# Patient Record
Sex: Male | Born: 1980 | Race: Black or African American | Hispanic: No | Marital: Single | Smoking: Current every day smoker
Health system: Southern US, Community
[De-identification: ages and names within clinical notes are randomized; demographics above are authoritative.]

---

## 2015-05-10 ENCOUNTER — Emergency Department
Admission: EM | Admit: 2015-05-10 | Discharge: 2015-05-10 | Disposition: A | Discharge status: Home / Self Care | Payer: Self-pay | Attending: Emergency Medicine | Admitting: Emergency Medicine

## 2015-05-10 ENCOUNTER — Encounter: Payer: Self-pay | Admitting: Emergency Medicine

## 2015-05-10 DIAGNOSIS — Z720 Tobacco use: Secondary | ICD-10-CM | POA: Insufficient documentation

## 2015-05-10 DIAGNOSIS — Z79899 Other long term (current) drug therapy: Secondary | ICD-10-CM | POA: Insufficient documentation

## 2015-05-10 DIAGNOSIS — H109 Unspecified conjunctivitis: Secondary | ICD-10-CM | POA: Insufficient documentation

## 2015-05-10 MED ORDER — SULFACETAMIDE SODIUM 10 % OP SOLN: 2.0000 [drp] | Freq: Four times a day (QID) | OPHTHALMIC | Status: DC

## 2015-05-10 NOTE — ED Notes (Signed)
Here for redness in right eye with clear drainage.

## 2015-05-10 NOTE — Discharge Instructions (Signed)

## 2015-05-10 NOTE — ED Provider Notes (Signed)
[facility] Emergency Department Provider Note  ____________________________________________  Time seen: Approximately 2:50 PM  I have reviewed the triage vital signs and the nursing notes.   HISTORY  Chief Complaint Eye Problem    HPI Jack Young is a 34 y.o. male 3 day history of redness and drainage to right eye. Eyes stuck shut in the mornings. Denies any trauma denies any visual changes.    No past medical history on file.  There are no active problems to display for this patient.   No past surgical history on file.  Current Outpatient Rx  Name  Route  Sig  Dispense  Refill  . sulfacetamide (BLEPH-10) 10 % ophthalmic solution   Both Eyes   Place 2 drops into both eyes 4 (four) times daily.   5 mL   0     Allergies Review of patient's allergies indicates no known allergies.  History reviewed. No pertinent family history.  Social History Social History  Substance Use Topics  . Smoking status: Current Every Day Smoker  . Smokeless tobacco: None  . Alcohol Use: None    Review of Systems  Constitutional: No fever/chills Eyes: No visual changes. Positive for red eyes and drainage ENT: No sore throat. Cardiovascular: Denies chest pain. Respiratory: Denies shortness of breath. Gastrointestinal: No abdominal pain.  No nausea, no vomiting.  No diarrhea.  No constipation. Genitourinary: Negative for dysuria. Musculoskeletal: Negative for back pain. Skin: Negative for rash. Neurological: Negative for headaches, focal weakness or numbness.   10-point ROS otherwise negative.  ____________________________________________   PHYSICAL EXAM:  VITAL SIGNS: ED Triage Vitals  Enc Vitals Group     BP 05/10/15 1349 108/59 mmHg     Pulse Rate 05/10/15 1349 67     Resp 05/10/15 1349 16     Temp 05/10/15 1349 97.7 F (36.5 C)     Temp Source 05/10/15 1349 Oral     SpO2 05/10/15 1349 100 %     Weight --      Height --      Head Cir  --      Peak Flow --      Pain Score 05/10/15 1348 8     Pain Loc --      Pain Edu? --      Excl. in GC? --     Constitutional: Alert and oriented. Well appearing and in no acute distress. Eyes: Right conjunctiva very erythematous with drainage noted.Marland Kitchen PERRL. EOMI. Head: Atraumatic. Nose: No congestion/rhinnorhea. Mouth/Throat: Mucous membranes are moist.  Oropharynx non-erythematous. Neck: No stridor.   Cardiovascular: Normal rate, regular rhythm. Grossly normal heart sounds.  Good peripheral circulation. Respiratory: Normal respiratory effort.  No retractions. Lungs CTAB. Gastrointestinal: Soft and nontender. No distention. No abdominal bruits. No CVA tenderness. Musculoskeletal: No lower extremity tenderness nor edema.  No joint effusions. Neurologic:  Normal speech and language. No gross focal neurologic deficits are appreciated. No gait instability. Skin:  Skin is warm, dry and intact. No rash noted. Psychiatric: Mood and affect are normal. Speech and behavior are normal.  ____________________________________________   LABS (all labs ordered are listed, but only abnormal results are displayed)  Labs Reviewed - No data to display ____________________________________________    PROCEDURES  Procedure(s) performed: None  Critical Care performed: No  ____________________________________________   INITIAL IMPRESSION / ASSESSMENT AND PLAN / ED COURSE  Pertinent labs & imaging results that were available during my care of the patient were reviewed by me and considered in my  medical decision making (see chart for details).  Right conjunctivitis. Rx given for sodium Sulamyd eyedrops. Work excuse 24 hours. Patient to follow up with PCP or return to the ER with any worsening symptomology.  Patient voices no other emergency medical complaints at this visit. ____________________________________________   FINAL CLINICAL IMPRESSION(S) / ED DIAGNOSES  Final diagnoses:   Conjunctivitis of right eye      Evangeline Dakin, PA-C 05/10/15 1459  Jennye Moccasin, MD 05/10/15 [phone]

## 2015-05-14 ENCOUNTER — Encounter: Payer: Self-pay | Admitting: Emergency Medicine

## 2015-05-14 ENCOUNTER — Emergency Department
Admission: EM | Admit: 2015-05-14 | Discharge: 2015-05-14 | Disposition: A | Discharge status: Home / Self Care | Payer: Self-pay | Attending: Emergency Medicine | Admitting: Emergency Medicine

## 2015-05-14 DIAGNOSIS — Z720 Tobacco use: Secondary | ICD-10-CM | POA: Insufficient documentation

## 2015-05-14 DIAGNOSIS — H109 Unspecified conjunctivitis: Secondary | ICD-10-CM | POA: Insufficient documentation

## 2015-05-14 DIAGNOSIS — Z79899 Other long term (current) drug therapy: Secondary | ICD-10-CM | POA: Insufficient documentation

## 2015-05-14 MED ORDER — POLYMYXIN B-TRIMETHOPRIM 10000-0.1 UNIT/ML-% OP SOLN: 2.0000 [drp] | OPHTHALMIC | Status: DC

## 2015-05-14 NOTE — ED Notes (Signed)
Patient here for recheck of pink eye symptoms, reports better but still bothering him.

## 2015-05-14 NOTE — Discharge Instructions (Signed)

## 2015-05-14 NOTE — ED Provider Notes (Signed)
[facility] Emergency Department Provider Note  ____________________________________________  Time seen: Approximately 2:18 PM  I have reviewed the triage vital signs and the nursing notes.   HISTORY  Chief Complaint Conjunctivitis    HPI Jacarri Gesner is a 34 y.o. male presents for recheck of conjunctivitis of his eyes. Patient states that when he was seen here 3 days ago was in his right eye but has now spread to his left eye. Has been taking his antibiotic eyedrops for 2 days. He feels like his eyes are getting better just not as quick as he would like them to. Reports that his eyes are stuck together in the mornings only.   History reviewed. No pertinent past medical history.  There are no active problems to display for this patient.   History reviewed. No pertinent past surgical history.  Current Outpatient Rx  Name  Route  Sig  Dispense  Refill  . sulfacetamide (BLEPH-10) 10 % ophthalmic solution   Both Eyes   Place 2 drops into both eyes 4 (four) times daily.   5 mL   0   . trimethoprim-polymyxin b (POLYTRIM) ophthalmic solution   Both Eyes   Place 2 drops into both eyes every 4 (four) hours.   10 mL   0     Allergies Review of patient's allergies indicates no known allergies.  History reviewed. No pertinent family history.  Social History Social History  Substance Use Topics  . Smoking status: Current Every Day Smoker  . Smokeless tobacco: None  . Alcohol Use: None    Review of Systems Constitutional: No fever/chills Eyes: No visual changes. Positive red eyes bilaterally ENT: No sore throat. Cardiovascular: Denies chest pain. Respiratory: Denies shortness of breath. Gastrointestinal: No abdominal pain.  No nausea, no vomiting.  No diarrhea.  No constipation. Genitourinary: Negative for dysuria. Musculoskeletal: Negative for back pain. Skin: Negative for rash. Neurological: Negative for headaches, focal weakness or  numbness.  10-point ROS otherwise negative.  ____________________________________________   PHYSICAL EXAM:  VITAL SIGNS: ED Triage Vitals  Enc Vitals Group     BP 05/14/15 1410 118/66 mmHg     Pulse Rate 05/14/15 1410 57     Resp 05/14/15 1410 18     Temp 05/14/15 1410 98.2 F (36.8 C)     Temp Source 05/14/15 1410 Oral     SpO2 05/14/15 1410 100 %     Weight 05/14/15 1410 140 lb (63.504 kg)     Height 05/14/15 1410  (1.778 m)     Head Cir --      Peak Flow --      Pain Score --      Pain Loc --      Pain Edu? --      Excl. in GC? --     Constitutional: Alert and oriented. Well appearing and in no acute distress. Eyes: Conjunctivae are erythematous bilaterally. Positive exudate. PERRL. EOMI. Head: Atraumatic. Nose: No congestion/rhinnorhea. Neck: No stridor.  No adenopathy Musculoskeletal: No lower extremity tenderness nor edema.  No joint effusions. Neurologic:  Normal speech and language. No gross focal neurologic deficits are appreciated. No gait instability. Skin:  Skin is warm, dry and intact. No rash noted. Psychiatric: Mood and affect are normal. Speech and behavior are normal.  ____________________________________________   LABS (all labs ordered are listed, but only abnormal results are displayed)  Labs Reviewed - No data to display ____________________________________________  PROCEDURES  Procedure(s) performed: None  Critical Care performed:  No  ____________________________________________   INITIAL IMPRESSION / ASSESSMENT AND PLAN / ED COURSE  Pertinent labs & imaging results that were available during my care of the patient were reviewed by me and considered in my medical decision making (see chart for details).  Bilateral conjunctivitis. Rx encourage to be continued with present drops. Follow up with PCP or return to the ER if no results in nor worsening symptomology next 48  hours. ____________________________________________   FINAL CLINICAL IMPRESSION(S) / ED DIAGNOSES  Final diagnoses:  Bilateral conjunctivitis      Evangeline Dakin, PA-C 05/14/15 1442  Phineas Semen, MD 05/14/15 1453

## 2015-12-13 ENCOUNTER — Encounter: Payer: Self-pay | Admitting: Emergency Medicine

## 2015-12-13 ENCOUNTER — Emergency Department
Admission: EM | Admit: 2015-12-13 | Discharge: 2015-12-13 | Disposition: A | Discharge status: Home / Self Care | Payer: Self-pay | Attending: Emergency Medicine | Admitting: Emergency Medicine

## 2015-12-13 ENCOUNTER — Emergency Department: Payer: Self-pay

## 2015-12-13 DIAGNOSIS — F17200 Nicotine dependence, unspecified, uncomplicated: Secondary | ICD-10-CM | POA: Insufficient documentation

## 2015-12-13 DIAGNOSIS — S20212A Contusion of left front wall of thorax, initial encounter: Secondary | ICD-10-CM | POA: Insufficient documentation

## 2015-12-13 DIAGNOSIS — Y999 Unspecified external cause status: Secondary | ICD-10-CM | POA: Insufficient documentation

## 2015-12-13 DIAGNOSIS — Y939 Activity, unspecified: Secondary | ICD-10-CM | POA: Insufficient documentation

## 2015-12-13 DIAGNOSIS — Y929 Unspecified place or not applicable: Secondary | ICD-10-CM | POA: Insufficient documentation

## 2015-12-13 MED ORDER — KETOROLAC TROMETHAMINE 10 MG PO TABS
10.0000 mg | ORAL_TABLET | Freq: Once | ORAL | Status: AC
  Administered 2015-12-13: 10 mg via ORAL
  Filled 2015-12-13: qty 1

## 2015-12-13 MED ORDER — KETOROLAC TROMETHAMINE 10 MG PO TABS: 10.0000 mg | ORAL_TABLET | Freq: Three times a day (TID) | ORAL | Status: DC | PRN
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
## 2015-12-13 NOTE — Discharge Instructions (Signed)
Rib Contusion A rib contusion is a deep bruise on your rib area. Contusions are the result of a blunt trauma that causes bleeding and injury to the tissues under the skin. A rib contusion may involve bruising of the ribs and of the skin and muscles in the area. The skin overlying the contusion may turn blue, purple, or yellow. Minor injuries will give you a painless contusion, but more severe contusions may stay painful and swollen for a few weeks. CAUSES  A contusion is usually caused by a blow, trauma, or direct force to an area of the body. This often occurs while playing contact sports. SYMPTOMS  Swelling and redness of the injured area.  Discoloration of the injured area.  Tenderness and soreness of the injured area.  Pain with or without movement. DIAGNOSIS  The diagnosis can be made by taking a medical history and performing a physical exam. An X-ray, CT scan, or MRI may be needed to determine if there were any associated injuries, such as broken bones (fractures) or internal injuries. TREATMENT  Often, the best treatment for a rib contusion is rest. Icing or applying cold compresses to the injured area may help reduce swelling and inflammation. Deep breathing exercises may be recommended to reduce the risk of partial lung collapse and pneumonia. Over-the-counter or prescription medicines may also be recommended for pain control. HOME CARE INSTRUCTIONS   Apply ice to the injured area:  Put ice in a plastic bag.  Place a towel between your skin and the bag.  Leave the ice on for 20 minutes, 2-3 times per day.  Take medicines only as directed by your health care provider.  Rest the injured area. Avoid strenuous activity and any activities or movements that cause pain. Be careful during activities and avoid bumping the injured area.  Perform deep-breathing exercises as directed by your health care provider.  Do not lift anything that is heavier than 5 lb (2.3 kg) until your  health care provider approves.  Do not use any tobacco products, including cigarettes, chewing tobacco, or electronic cigarettes. If you need help quitting, ask your health care provider. SEEK MEDICAL CARE IF:   You have increased bruising or swelling.  You have pain that is not controlled with treatment.  You have a fever. SEEK IMMEDIATE MEDICAL CARE IF:   You have difficulty breathing or shortness of breath.  You develop a continual cough, or you cough up thick or bloody sputum.  You feel sick to your stomach (nauseous), you throw up (vomit), or you have abdominal pain.   This information is not intended to replace advice given to you by your health care provider. Make sure you discuss any questions you have with your health care provider.   Document Released: 05/14/2001 Document Revised: 09/09/2014 Document Reviewed: 05/31/2014 Elsevier Interactive Patient Education 2016 [employer]  
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
## 2016-09-11 ENCOUNTER — Encounter: Payer: Self-pay | Admitting: *Deleted

## 2016-09-11 ENCOUNTER — Emergency Department
Admission: EM | Admit: 2016-09-11 | Discharge: 2016-09-11 | Disposition: A | Discharge status: Home / Self Care | Payer: Self-pay | Attending: Emergency Medicine | Admitting: Emergency Medicine

## 2016-09-11 DIAGNOSIS — K047 Periapical abscess without sinus: Secondary | ICD-10-CM | POA: Insufficient documentation

## 2016-09-11 DIAGNOSIS — F17200 Nicotine dependence, unspecified, uncomplicated: Secondary | ICD-10-CM | POA: Insufficient documentation

## 2016-09-11 MED ORDER — IBUPROFEN 800 MG PO TABS
800.0000 mg | ORAL_TABLET | Freq: Three times a day (TID) | ORAL | 0 refills | Status: DC | PRN
Start: 2016-09-11 — End: 2018-07-24

## 2016-09-11 MED ORDER — PENICILLIN V POTASSIUM 500 MG PO TABS: 500.0000 mg | ORAL_TABLET | Freq: Four times a day (QID) | ORAL | 0 refills | Status: DC

## 2016-09-11 NOTE — ED Provider Notes (Signed)
[facility] Emergency Department Provider Note        Time seen: ----------------------------------------- 1:27 PM on 09/11/2016 -----------------------------------------    I have reviewed the triage vital signs and the nursing notes.   HISTORY  Chief Complaint Dental Pain    HPI Jack Young is a 36 y.o. male who presents to the ER for dental pain and swelling on the right side of his face. Patient reports a history of having a tooth broken on that side. Pain is worse with eating, nothing makes it better. Currently 10. Patient states he does not have dental insurance.   History reviewed. No pertinent past medical history.  There are no active problems to display for this patient.   History reviewed. No pertinent surgical history.  Allergies Patient has no known allergies.  Social History Social History  Substance Use Topics  . Smoking status: Current Every Day Smoker  . Smokeless tobacco: Not on file  . Alcohol use Not on file    Review of Systems Constitutional: Negative for fever. ENT: Positive for toothache, facial swelling Cardiovascular: Negative for chest pain. Respiratory: Negative for shortness of breath. Skin: Negative for rash. Neurological: Negative for headaches, focal weakness or numbness.  10-point ROS otherwise negative.  ____________________________________________   PHYSICAL EXAM:  VITAL SIGNS: ED Triage Vitals  Enc Vitals Group     BP 09/11/16 1214 119/65     Pulse Rate 09/11/16 1214 (!) 55     Resp 09/11/16 1214 18     Temp 09/11/16 1214 98.3 F (36.8 C)     Temp Source 09/11/16 1214 Oral     SpO2 09/11/16 1214 100 %     Weight 09/11/16 1214 155 lb (70.3 kg)     Height 09/11/16 1214 5\' 10"  (1.778 m)     Head Circumference --      Peak Flow --      Pain Score 09/11/16 1213 9     Pain Loc --      Pain Edu? --      Excl. in GC? --     Constitutional: Alert and oriented. Well appearing and in no  distress. Eyes: Conjunctivae are normal. PERRL. Normal extraocular movements. ENT   Head: Normocephalic and atraumatic.   Nose: No congestion/rhinnorhea.   Mouth/Throat: Mucous membranes are moist.Dental caries are noted, there is tooth fracture and perimandibular abscess noted anteriorly on the lower right   Neck: No stridor. Respiratory: Normal respiratory effort without tachypnea nor retractions.  Neurologic:  Normal speech and language. No gross focal neurologic deficits are appreciated.  Skin:  Skin is warm, dry and intact. No rash noted. Psychiatric: Mood and affect are normal. Speech and behavior are normal.  ____________________________________________  ED COURSE:  Pertinent labs & imaging results that were available during my care of the patient were reviewed by me and considered in my medical decision making (see chart for details). Clinical Course   Patient is in no acute distress, presents for toothache. He'll be discharged with antibiotics and pain medication and referral to a dental clinic.  Procedures ____________________________________________  FINAL ASSESSMENT AND PLAN  Toothache, dental abscess  Plan: Patient with toothache and dental abscess. He'll be started on penicillin with Motrin and referred to dentistry for outpatient follow-up.   Emily FilbertWilliams, Terrisha Lopata E, MD   Note: This dictation was prepared with Dragon dictation. Any transcriptional errors that result from this process are unintentional    Emily FilbertJonathan E Mollye Guinta, MD 09/11/16 1329

## 2016-09-11 NOTE — ED Triage Notes (Signed)
States dental pain and swelling on the right side of his face, states broken tooth

## 2016-12-01 ENCOUNTER — Emergency Department
Admission: EM | Admit: 2016-12-01 | Discharge: 2016-12-01 | Disposition: A | Discharge status: Home / Self Care | Payer: Self-pay | Attending: Emergency Medicine | Admitting: Emergency Medicine

## 2016-12-01 ENCOUNTER — Encounter: Payer: Self-pay | Admitting: Emergency Medicine

## 2016-12-01 DIAGNOSIS — Z791 Long term (current) use of non-steroidal anti-inflammatories (NSAID): Secondary | ICD-10-CM | POA: Insufficient documentation

## 2016-12-01 DIAGNOSIS — Y929 Unspecified place or not applicable: Secondary | ICD-10-CM | POA: Insufficient documentation

## 2016-12-01 DIAGNOSIS — Y9389 Activity, other specified: Secondary | ICD-10-CM | POA: Insufficient documentation

## 2016-12-01 DIAGNOSIS — Y999 Unspecified external cause status: Secondary | ICD-10-CM | POA: Insufficient documentation

## 2016-12-01 DIAGNOSIS — S81801A Unspecified open wound, right lower leg, initial encounter: Secondary | ICD-10-CM | POA: Insufficient documentation

## 2016-12-01 DIAGNOSIS — Z79899 Other long term (current) drug therapy: Secondary | ICD-10-CM | POA: Insufficient documentation

## 2016-12-01 DIAGNOSIS — Z23 Encounter for immunization: Secondary | ICD-10-CM | POA: Insufficient documentation

## 2016-12-01 DIAGNOSIS — F17210 Nicotine dependence, cigarettes, uncomplicated: Secondary | ICD-10-CM | POA: Insufficient documentation

## 2016-12-01 MED ORDER — TETANUS-DIPHTH-ACELL PERTUSSIS 5-2.5-18.5 LF-MCG/0.5 IM SUSP
0.5000 mL | Freq: Once | INTRAMUSCULAR | Status: AC
  Administered 2016-12-01: 0.5 mL via INTRAMUSCULAR
  Filled 2016-12-01: qty 0.5

## 2016-12-01 MED ORDER — IBUPROFEN 600 MG PO TABS: 600.0000 mg | ORAL_TABLET | Freq: Three times a day (TID) | ORAL | 0 refills | Status: DC | PRN

## 2016-12-01 MED ORDER — SULFAMETHOXAZOLE-TRIMETHOPRIM 800-160 MG PO TABS: 1.0000 | ORAL_TABLET | Freq: Two times a day (BID) | ORAL | 0 refills | Status: DC

## 2016-12-01 MED ORDER — TRAMADOL HCL 50 MG PO TABS
50.0000 mg | ORAL_TABLET | Freq: Once | ORAL | Status: DC
Start: 2016-12-01 — End: 2016-12-02

## 2016-12-01 MED ORDER — SULFAMETHOXAZOLE-TRIMETHOPRIM 800-160 MG PO TABS
1.0000 | ORAL_TABLET | Freq: Once | ORAL | Status: AC
  Administered 2016-12-01: 1 via ORAL
  Filled 2016-12-01: qty 1
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
## 2016-12-01 NOTE — ED Notes (Signed)
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
## 2016-12-04 ENCOUNTER — Emergency Department
Admission: EM | Admit: 2016-12-04 | Discharge: 2016-12-04 | Disposition: A | Discharge status: Home / Self Care | Payer: Self-pay | Attending: Emergency Medicine | Admitting: Emergency Medicine

## 2016-12-04 DIAGNOSIS — Z4800 Encounter for change or removal of nonsurgical wound dressing: Secondary | ICD-10-CM | POA: Insufficient documentation

## 2016-12-04 DIAGNOSIS — Z791 Long term (current) use of non-steroidal anti-inflammatories (NSAID): Secondary | ICD-10-CM | POA: Insufficient documentation

## 2016-12-04 DIAGNOSIS — F17210 Nicotine dependence, cigarettes, uncomplicated: Secondary | ICD-10-CM | POA: Insufficient documentation

## 2016-12-04 DIAGNOSIS — Z79899 Other long term (current) drug therapy: Secondary | ICD-10-CM | POA: Insufficient documentation

## 2016-12-04 DIAGNOSIS — M25561 Pain in right knee: Secondary | ICD-10-CM | POA: Insufficient documentation

## 2016-12-04 DIAGNOSIS — Z5189 Encounter for other specified aftercare: Secondary | ICD-10-CM
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
## 2016-12-04 NOTE — Discharge Instructions (Signed)
Keep area clean and dry. Watch for signs of infection. Change dressing daily. Do not take anti-inflammatories at this time because of increased risk of bleeding. May take Tylenol if needed. Follow-up with [facility]  clinic if any continued problems.

## 2016-12-04 NOTE — ED Notes (Signed)
AAOx3.  Skin warm and dry.  Ambulates with easy and steady gait. NAD 

## 2016-12-04 NOTE — ED Notes (Signed)
Approximate 1 inch wound area to medial lower leg / knee area.  Bleeding controlled.  Skin warm and dry.
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
## 2018-07-24 ENCOUNTER — Encounter: Payer: Self-pay | Admitting: Emergency Medicine

## 2018-07-24 ENCOUNTER — Other Ambulatory Visit: Payer: Self-pay

## 2018-07-24 ENCOUNTER — Emergency Department
Admission: EM | Admit: 2018-07-24 | Discharge: 2018-07-24 | Disposition: A | Discharge status: Home / Self Care | Payer: Self-pay | Attending: Emergency Medicine | Admitting: Emergency Medicine

## 2018-07-24 DIAGNOSIS — K029 Dental caries, unspecified: Secondary | ICD-10-CM

## 2018-07-24 DIAGNOSIS — F17200 Nicotine dependence, unspecified, uncomplicated: Secondary | ICD-10-CM | POA: Insufficient documentation

## 2018-07-24 DIAGNOSIS — K047 Periapical abscess without sinus: Secondary | ICD-10-CM | POA: Insufficient documentation

## 2018-07-24 MED ORDER — AMOXICILLIN 500 MG PO CAPS
500.0000 mg | ORAL_CAPSULE | Freq: Once | ORAL | Status: AC
  Administered 2018-07-24: 500 mg via ORAL
  Filled 2018-07-24: qty 1

## 2018-07-24 MED ORDER — AMOXICILLIN 500 MG PO CAPS: 500.0000 mg | ORAL_CAPSULE | Freq: Three times a day (TID) | ORAL | 0 refills | Status: AC

## 2018-07-24 NOTE — Discharge Instructions (Addendum)
You are being treated for a dental abscess. Take the antibiotic as directed. Rinse with warm salty water after every meal. Brush with a soft toothbrush twice daily. Follow-up with one of the dental clinics listed below.   OPTIONS FOR DENTAL FOLLOW UP CARE  [place] Department of Health and Human Services - Local Safety Net Dental Clinics TripDoors.comhttp://www.ncdhhs.gov/dph/oralhealth/services/safetynetclinics.htm   [facility] [phone])  [facility] [phone])  [facility] [phone]  [facility] [phone])  [facility] [phone]) This clinic caters to the indigent population and is on a lottery system. Location: [employer] [address] [address], [address], [place] [facility] Hours: Wednesdays from 6pm - 9pm, patients seen by a lottery system. For dates, call or go to [email or web address] Services: Cleanings, fillings and simple extractions. Payment Options: DENTAL WORK IS FREE OF CHARGE. Bring proof of income or support. Best way to get seen: Arrive at 5:15 pm - this is a lottery, NOT first come/first serve, so arriving earlier will not increase your chances of being seen.     [facility] [phone] Select option 1 for emergencies   Location: [facility], [address], [place] [facility] Hours: No walk-ins accepted - call the day before to schedule an appointment. Check in times are 9:30 am and 1:30 pm. Services: Simple extractions, temporary fillings, pulpectomy/pulp debridement, uncomplicated abscess drainage. Payment Options: PAYMENT IS DUE AT THE TIME OF SERVICE.  Fee is usually $100-200, additional surgical procedures (e.g. abscess drainage) may be extra. Cash, checks, Visa/MasterCard accepted.  Can file Medicaid if patient is covered for dental - patient should call case worker to check. No discount for  [facility] patients. Best way to get seen: MUST call the day before and get onto the schedule. Can usually be seen the next 1-2 days. No walk-ins accepted.     [facility] [phone]   Location: [facility], [address], [place] [facility] Hours: M, W, Th, F 8am or 1:30pm, Tues 9a or 1:30 - first come/first served. Services: Simple extractions, temporary fillings, uncomplicated abscess drainage.  You do not need to be an [facility] [place] [place] resident. Payment Options: PAYMENT IS DUE AT THE TIME OF SERVICE. Dental insurance, otherwise sliding scale - bring proof of income or support. Depending on income and treatment needed, cost is usually $50-200. Best way to get seen: Arrive early as it is first come/first served.     [facility] [phone]   Location: [address] Clinic Hours: Mon-Thu 8a-5p Services: Most basic dental services including extractions and fillings. Payment Options: PAYMENT IS DUE AT THE TIME OF SERVICE. Sliding scale, up to 50% off - bring proof if income or support. Medicaid with dental option accepted. Best way to get seen: Call to schedule an appointment, can usually be seen within 2 weeks OR they will try to see walk-ins - show up at 8a or 2p (you may have to wait).     [facility] [phone] [place] RESIDENTS ONLY   Location: [facility], [address], [place], [place] [id number] Clinic Hours: By appointment only. Monday - Thursday 8am-5pm, Friday 8am-12pm Services: Cleanings, fillings, extractions. Payment Options: PAYMENT IS DUE AT THE TIME OF SERVICE. Cash, Visa or MasterCard. Sliding scale - $30 minimum per service. Best way to get seen: Come in to office, complete packet and make an appointment - need proof  of income or support monies for each household member and proof of [facility] residence. Usually takes  about a month to get in.     [facility] [phone]   Location: [address] [facility] Hours: Walk-in Urgent Care Dental Services are offered Monday-Friday mornings only. The numbers of emergencies accepted daily is limited to the number of providers available. Maximum 15 - Mondays, Wednesdays & Thursdays Maximum 10 - Tuesdays & Fridays Services: You do not need to be a [facility] resident to be seen for a dental emergency. Emergencies are defined as pain, swelling, abnormal bleeding, or dental trauma. Walkins will receive x-rays if needed. NOTE: Dental cleaning is not an emergency. Payment Options: PAYMENT IS DUE AT THE TIME OF SERVICE. Minimum co-pay is $40.00 for uninsured patients. Minimum co-pay is $3.00 for Medicaid with dental coverage. Dental Insurance is accepted and must be presented at time of visit. Medicare does not cover dental. Forms of payment: Cash, credit card, checks. Best way to get seen: If not previously registered with the clinic, walk-in dental registration begins at 7:15 am and is on a first come/first serve basis. If previously registered with the clinic, call to make an appointment.     [facility] [phone] [place] RESIDENTS ONLY   Location: [address], [place], [place] Clinic Hours: Mon-Thu 10a-2p Services: Extractions only! Payment Options: FREE (donations accepted) - bring proof of income or support Best way to get seen: Call and schedule an appointment OR come at 8am on the 1st Monday of every month (except for holidays) when it is first come/first served.     [facility] [phone]   Location: [address] [place], [place] Clinic Hours: Friday mornings Services, Payment Options, Best way to get seen: Call for info.

## 2018-07-24 NOTE — ED Provider Notes (Signed)
[facility] Emergency Department Provider Note ____________________________________________  Time seen: 1514  I have reviewed the triage vital signs and the nursing notes.  HISTORY  Chief Complaint  Dental Pain  HPI Jack MantisCarl Young is a 37 y.o. male presents himself to the ED for evaluation of dental pain and swelling to the left gumline. He notes poor dentition including multiple chronically broken teeth. Patient denies any interim fevers, chills, or sweats.  He is also denies any chest pain, shortness of breath, difficulty controlling secretions.  History reviewed. No pertinent past medical history.  There are no active problems to display for this patient.  History reviewed. No pertinent surgical history.  Prior to Admission medications   Medication Sig Start Date End Date Taking? Authorizing Provider  amoxicillin (AMOXIL) 500 MG capsule Take 1 capsule (500 mg total) by mouth 3 (three) times daily. 07/24/18   Ademola Vert, Charlesetta IvoryJenise V Bacon, PA-C    Allergies Patient has no known allergies.  No family history on file.  Social History Social History   Tobacco Use  . Smoking status: Current Every Day Smoker    Packs/day: 0.50    Types: Cigarettes  . Smokeless tobacco: Never Used  Substance Use Topics  . Alcohol use: Yes    Comment: Only on weekends  . Drug use: No    Review of Systems  Constitutional: Negative for fever. Eyes: Negative for visual changes. ENT: Negative for sore throat.  Dental pain as above. Cardiovascular: Negative for chest pain. Respiratory: Negative for shortness of breath. Gastrointestinal: Negative for abdominal pain, vomiting and diarrhea. Musculoskeletal: Negative for back pain. Skin: Negative for rash. Neurological: Negative for headaches, focal weakness or numbness. ____________________________________________  PHYSICAL EXAM:  VITAL SIGNS: ED Triage Vitals  Enc Vitals Group     BP 07/24/18 1506 122/74     Pulse  Rate 07/24/18 1506 72     Resp 07/24/18 1506 18     Temp 07/24/18 1506 98.7 F (37.1 C)     Temp Source 07/24/18 1506 Oral     SpO2 07/24/18 1506 98 %     Weight 07/24/18 1506 140 lb (63.5 kg)     Height 07/24/18 1506 5\' 10"  (1.778 m)     Head Circumference --      Peak Flow --      Pain Score 07/24/18 1511 8     Pain Loc --      Pain Edu? --      Excl. in GC? --     Constitutional: Alert and oriented. Well appearing and in no distress. Head: Normocephalic and atraumatic. Eyes: Conjunctivae are normal. PERRL. Normal extraocular movements Ears: Canals clear. TMs intact bilaterally. Nose: No congestion/rhinorrhea/epistaxis. Mouth/Throat: Mucous membranes are moist.  Uvula is midline and tonsils are flat.  No oropharyngeal lesions are appreciated.  Patient with multiple broken molars to the upper and lower jaw.  The left lower jaw notes a chronically broken left second molar with focal buccal edema.  No spontaneous drainage is noted.  No pointing, fluctuant lesion is noted.  No brawny sublingual erythema is noted. Neck: Supple. No thyromegaly. Hematological/Lymphatic/Immunological: No cervical lymphadenopathy. Cardiovascular: Normal rate, regular rhythm. Normal distal pulses. Respiratory: Normal respiratory effort. No wheezes/rales/rhonchi. ___________________________________________  PROCEDURES  Procedures Amoxicillin 500 mg p.o. ____________________________________________  INITIAL IMPRESSION / ASSESSMENT AND PLAN / ED COURSE  Patient with a ED evaluation of a 2 to 3-day complaint of left lower jaw gumline swelling secondary to dental caries.  Patient's clinical picture is consistent  with a focal dental abscess.  Started on amoxicillin for abscess management.  Patient is discharged with a prescription for antibiotic after single dose was administered here in the ED.  He is given a referral to many of the local community clinics that offer dental care.  Return precautions have been  reviewed. ____________________________________________  FINAL CLINICAL IMPRESSION(S) / ED DIAGNOSES  Final diagnoses:  Pain due to dental caries  Dental abscess      Lissa Hoard, PA-C 07/24/18 1611    Arnaldo Natal, MD 07/24/18 2326

## 2018-07-24 NOTE — ED Triage Notes (Signed)
Presents with dental and swelling to left gumline

## 2019-11-02 ENCOUNTER — Ambulatory Visit: Payer: Self-pay | Attending: Internal Medicine

## 2019-11-02 ENCOUNTER — Other Ambulatory Visit: Payer: Self-pay

## 2019-11-02 DIAGNOSIS — Z20822 Contact with and (suspected) exposure to covid-19: Secondary | ICD-10-CM

## 2019-11-03 LAB — NOVEL CORONAVIRUS, NAA: SARS-CoV-2, NAA: NOT DETECTED

## 2021-09-06 ENCOUNTER — Other Ambulatory Visit: Payer: Self-pay

## 2021-09-06 ENCOUNTER — Encounter: Payer: Self-pay | Admitting: Emergency Medicine

## 2021-09-06 ENCOUNTER — Ambulatory Visit
Admission: EM | Admit: 2021-09-06 | Discharge: 2021-09-06 | Disposition: A | Discharge status: Home / Self Care | Payer: Self-pay

## 2021-09-06 DIAGNOSIS — R531 Weakness: Secondary | ICD-10-CM

## 2021-09-06 DIAGNOSIS — R112 Nausea with vomiting, unspecified: Secondary | ICD-10-CM

## 2021-09-06 NOTE — ED Provider Notes (Signed)
Jack Young    CSN: [id number] Arrival date & time: 09/06/21  1204      History   Chief Complaint Chief Complaint  Patient presents with   Nausea   Emesis    HPI Jack Young is a 41 y.o. male.  Patient presents with generalized weakness x4 days.  He had nausea and vomiting but these symptoms resolved.  No emesis today.  He denies fever, chills, chest pain, shortness of breath, abdominal pain, diarrhea, or other symptoms.  Patient states he has been drinking Pedialyte without difficulty.  He needs a note for missing work today.  The history is provided by the patient.   History reviewed. No pertinent past medical history.  There are no problems to display for this patient.   History reviewed. No pertinent surgical history.     Home Medications    Prior to Admission medications   Medication Sig Start Date End Date Taking? Authorizing Provider  amoxicillin (AMOXIL) 500 MG capsule Take 1 capsule (500 mg total) by mouth 3 (three) times daily. 07/24/18   Menshew, Dannielle Karvonen, PA-C    Family History History reviewed. No pertinent family history.  Social History Social History   Tobacco Use   Smoking status: Every Day    Packs/day: 0.50    Types: Cigarettes   Smokeless tobacco: Never  Substance Use Topics   Alcohol use: Yes    Comment: Only on weekends   Drug use: No     Allergies   Patient has no known allergies.   Review of Systems Review of Systems  Constitutional:  Negative for chills and fever.  Respiratory:  Negative for cough and shortness of breath.   Cardiovascular:  Negative for chest pain and palpitations.  Gastrointestinal:  Positive for nausea and vomiting. Negative for abdominal pain and diarrhea.  Skin:  Negative for color change and rash.  Neurological:  Negative for dizziness, syncope and numbness.       Generalized weakness.  All other systems reviewed and are negative.   Physical Exam Triage Vital Signs ED Triage  Vitals  Enc Vitals Group     BP 09/06/21 1242 115/69     Pulse Rate 09/06/21 1242 65     Resp 09/06/21 1242 18     Temp 09/06/21 1242 98.2 F (36.8 C)     Temp src --      SpO2 09/06/21 1242 96 %     Weight --      Height --      Head Circumference --      Peak Flow --      Pain Score 09/06/21 1249 0     Pain Loc --      Pain Edu? --      Excl. in [place]? --    No data found.  Updated Vital Signs BP 115/69 (BP Location: Left Arm)    Pulse 65    Temp 98.2 F (36.8 C)    Resp 18    SpO2 96%   Visual Acuity Right Eye Distance:   Left Eye Distance:   Bilateral Distance:    Right Eye Near:   Left Eye Near:    Bilateral Near:     Physical Exam Vitals and nursing note reviewed.  Constitutional:      General: He is not in acute distress.    Appearance: He is well-developed. He is not ill-appearing.  HENT:     Mouth/Throat:     Mouth: Mucous membranes  are moist.  Cardiovascular:     Rate and Rhythm: Normal rate and regular rhythm.     Heart sounds: Normal heart sounds.  Pulmonary:     Effort: Pulmonary effort is normal. No respiratory distress.     Breath sounds: Normal breath sounds.  Abdominal:     General: Bowel sounds are normal.     Palpations: Abdomen is soft.     Tenderness: There is no abdominal tenderness. There is no guarding or rebound.  Musculoskeletal:     Cervical back: Neck supple.  Skin:    General: Skin is warm and dry.  Neurological:     General: No focal deficit present.     Mental Status: He is alert and oriented to person, place, and time.     Gait: Gait normal.  Psychiatric:        Mood and Affect: Mood normal.        Behavior: Behavior normal.     UC Treatments / Results  Labs (all labs ordered are listed, but only abnormal results are displayed) Labs Reviewed - No data to display  EKG   Radiology No results found.  Procedures Procedures (including critical care time)  Medications Ordered in UC Medications - No data to  display  Initial Impression / Assessment and Plan / UC Course  I have reviewed the triage vital signs and the nursing notes.  Pertinent labs & imaging results that were available during my care of the patient were reviewed by me and considered in my medical decision making (see chart for details).  Nausea, vomiting, generalized weakness.  Work note provided per patient request.  He declines treatment with antinausea medication today.  He has been drinking Pedialyte at home without emesis today.  Vital signs stable.  Patient is well-appearing.  Education provided on nausea and vomiting.  Instructed him to follow-up with his PCP if his symptoms are not improving.  ED precautions given.  Patient agrees to plan of care.   Final Clinical Impressions(s) / UC Diagnoses   Final diagnoses:  Nausea and vomiting, unspecified vomiting type  Generalized weakness     Discharge Instructions      Keep yourself hydrated with clear liquids, such as water and Gatorade.    Go to the emergency department if you have acute worsening symptoms.    Follow up with your primary care provider if your symptoms are not improving.          ED Prescriptions   None    PDMP not reviewed this encounter.   Sharion Balloon, NP 09/06/21 [phone]

## 2021-09-06 NOTE — Discharge Instructions (Addendum)
Keep yourself hydrated with clear liquids, such as water and Gatorade.    Go to the emergency department if you have acute worsening symptoms.    Follow up with your primary care provider if your symptoms are not improving.      

## 2021-09-06 NOTE — ED Triage Notes (Signed)
Pt here with N/V since New Years after drinking. Pt reports that the sx have stopped, but still feels weak and unable to go to work. Pt reports being able to keep down pedialyte this morning.
